# Patient Record
Sex: Female | Born: 1970 | Race: Black or African American | Hispanic: No | Marital: Single | Smoking: Current every day smoker
Health system: Southern US, Community
[De-identification: ages and names within clinical notes are randomized; demographics above are authoritative.]

## PROBLEM LIST (undated history)

## (undated) DIAGNOSIS — I10 Essential (primary) hypertension: Secondary | ICD-10-CM

## (undated) HISTORY — PX: ANKLE SURGERY: SHX546

---

## 2011-06-19 ENCOUNTER — Emergency Department (HOSPITAL_COMMUNITY)
Admission: EM | Admit: 2011-06-19 | Discharge: 2011-06-19 | Disposition: A | Discharge status: Home / Self Care | Payer: Self-pay | Attending: Emergency Medicine | Admitting: Emergency Medicine

## 2011-06-19 DIAGNOSIS — B349 Viral infection, unspecified: Secondary | ICD-10-CM

## 2011-06-19 DIAGNOSIS — R6883 Chills (without fever): Secondary | ICD-10-CM | POA: Insufficient documentation

## 2011-06-19 DIAGNOSIS — B9789 Other viral agents as the cause of diseases classified elsewhere: Secondary | ICD-10-CM | POA: Insufficient documentation

## 2011-06-19 DIAGNOSIS — R0602 Shortness of breath: Secondary | ICD-10-CM | POA: Insufficient documentation

## 2011-06-19 DIAGNOSIS — R109 Unspecified abdominal pain: Secondary | ICD-10-CM | POA: Insufficient documentation

## 2011-06-19 DIAGNOSIS — R112 Nausea with vomiting, unspecified: Secondary | ICD-10-CM | POA: Insufficient documentation

## 2011-06-19 DIAGNOSIS — R05 Cough: Secondary | ICD-10-CM | POA: Insufficient documentation

## 2011-06-19 DIAGNOSIS — R42 Dizziness and giddiness: Secondary | ICD-10-CM | POA: Insufficient documentation

## 2011-06-19 DIAGNOSIS — R61 Generalized hyperhidrosis: Secondary | ICD-10-CM | POA: Insufficient documentation

## 2011-06-19 DIAGNOSIS — R059 Cough, unspecified: Secondary | ICD-10-CM | POA: Insufficient documentation

## 2011-06-19 MED ORDER — DOXYCYCLINE HYCLATE 100 MG PO CAPS: 100.0000 mg | ORAL_CAPSULE | Freq: Two times a day (BID) | ORAL | Status: AC

## 2011-06-19 NOTE — ED Notes (Signed)
Patient presents with cough, chills, sweats, vomiting x 2 and body aches x 1 day.

## 2011-06-19 NOTE — ED Provider Notes (Cosign Needed)
History     CSN: [id number] Arrival date & time: 06/19/2011 10:21 AM   First MD Initiated Contact with Patient 06/19/11 1102      Chief Complaint  Patient presents with  . Cough  . Chills    (Consider location/radiation/quality/duration/timing/severity/associated sxs/prior treatment) HPI  Patient relates yesterday she started feeling lightheaded, having chills, sweats, diffuse abdominal tenderness with nausea and posttussive vomiting, she denies diarrhea, sore throat. States she is coughing and sometimes has blood streaked brown sputum. She has a history of bronchitis with wheezing in the past however she denies wheezing with this episode. She states sometimes she feels short of breath.  History reviewed. No pertinent past medical history.  Bronchitis with bronchospasm  Past Surgical History  Procedure Date  . Ankle surgery     History reviewed. No pertinent family history.  History  Substance Use Topics  . Smoking status: Current Everyday Smoker  . Smokeless tobacco: Not on file  . Alcohol Use: Yes   patient works at [employer] History    Grav Para Term Preterm Abortions TAB SAB Ect Mult Living                  Review of Systems  All other systems reviewed and are negative.    Allergies  Review of patient's allergies indicates no known allergies.  Home Medications   Current Outpatient Rx  Name Route Sig Dispense Refill  . IBUPROFEN 200 MG PO TABS Oral Take 400 mg by mouth every 6 (six) hours as needed. For pain     . PSEUDOEPH-DOXYLAMINE-DM-APAP 60-7.12-06-998 MG/30ML PO LIQD Oral Take 30 mLs by mouth at bedtime as needed. For cold     . DOXYCYCLINE HYCLATE 100 MG PO CAPS Oral Take 1 capsule (100 mg total) by mouth 2 (two) times daily. 20 capsule 0    BP 143/83  Pulse 95  Temp(Src) 99.6 F (37.6 C) (Oral)  Resp 18  SpO2 94%  LMP 05/23/2011 Vital signs show low-grade fever otherwise normal  Physical Exam  Nursing note  reviewed. Constitutional: She is oriented to person, place, and time. She appears well-developed and well-nourished.  Non-toxic appearance. She does not appear ill. No distress.  HENT:  Head: Normocephalic and atraumatic.  Right Ear: External ear normal.  Left Ear: External ear normal.  Nose: Nose normal. No mucosal edema or rhinorrhea.  Mouth/Throat: Oropharynx is clear and moist and mucous membranes are normal. No dental abscesses or uvula swelling.  Eyes: Conjunctivae and EOM are normal. Pupils are equal, round, and reactive to light.  Neck: Normal range of motion and full passive range of motion without pain. Neck supple.  Cardiovascular: Normal rate, regular rhythm and normal heart sounds.  Exam reveals no gallop and no friction rub.   No murmur heard. Pulmonary/Chest: Effort normal and breath sounds normal. No respiratory distress. She has no wheezes. She has no rhonchi. She has no rales. She exhibits no tenderness and no crepitus.        Patient noted to have cough at times.  Abdominal: Soft. Normal appearance and bowel sounds are normal. She exhibits no distension. There is no tenderness. There is no rebound and no guarding.  Musculoskeletal: Normal range of motion. She exhibits no edema and no tenderness.       Moves all extremities well.   Neurological: She is alert and oriented to person, place, and time. She has normal strength. No cranial nerve deficit.  Skin: Skin is warm, dry and intact.  No rash noted. No erythema. No pallor.  Psychiatric: She has a normal mood and affect. Her speech is normal and behavior is normal. Her mood appears not anxious.    ED Course  Procedures (including critical care time)    Diagnoses that have been ruled out:  Diagnoses that are still under consideration:  Final diagnoses:  Viral illness   New Prescriptions   DOXYCYCLINE (VIBRAMYCIN) 100 MG CAPSULE    Take 1 capsule (100 mg total) by mouth 2 (two) times daily.    Plan discharge  Devoria Albe, MD, FACEP   MDM          Ward Givens, MD 06/19/11 [phone]

## 2012-12-14 ENCOUNTER — Emergency Department (HOSPITAL_COMMUNITY): Payer: Self-pay

## 2012-12-14 ENCOUNTER — Encounter (HOSPITAL_COMMUNITY): Payer: Self-pay | Admitting: Emergency Medicine

## 2012-12-14 ENCOUNTER — Emergency Department (HOSPITAL_COMMUNITY)
Admission: EM | Admit: 2012-12-14 | Discharge: 2012-12-14 | Disposition: A | Discharge status: Home / Self Care | Payer: Self-pay | Attending: Emergency Medicine | Admitting: Emergency Medicine

## 2012-12-14 DIAGNOSIS — R42 Dizziness and giddiness: Secondary | ICD-10-CM | POA: Insufficient documentation

## 2012-12-14 DIAGNOSIS — R209 Unspecified disturbances of skin sensation: Secondary | ICD-10-CM | POA: Insufficient documentation

## 2012-12-14 DIAGNOSIS — R55 Syncope and collapse: Secondary | ICD-10-CM

## 2012-12-14 DIAGNOSIS — I10 Essential (primary) hypertension: Secondary | ICD-10-CM

## 2012-12-14 DIAGNOSIS — R200 Anesthesia of skin: Secondary | ICD-10-CM

## 2012-12-14 DIAGNOSIS — F17200 Nicotine dependence, unspecified, uncomplicated: Secondary | ICD-10-CM | POA: Insufficient documentation

## 2012-12-14 LAB — BASIC METABOLIC PANEL
BUN: 16 mg/dL (ref 6–23)
CO2: 25 mEq/L (ref 19–32)
Calcium: 9.2 mg/dL (ref 8.4–10.5)
Chloride: 101 mEq/L (ref 96–112)
Creatinine, Ser: 0.91 mg/dL (ref 0.50–1.10)
GFR calc Af Amer: >90 mL/min (ref >90)
GFR calc non Af Amer: 78 mL/min — ABNORMAL LOW (ref >90)
Glucose, Bld: 85 mg/dL (ref 70–99)
Potassium: 3.9 mEq/L (ref 3.5–5.1)
Sodium: 136 mEq/L (ref 135–145)

## 2012-12-14 LAB — URINALYSIS, ROUTINE W REFLEX MICROSCOPIC
Bilirubin Urine: NEGATIVE
Glucose, UA: 100 mg/dL — AB
Hgb urine dipstick: NEGATIVE
Ketones, ur: NEGATIVE mg/dL
Leukocytes, UA: NEGATIVE
Nitrite: NEGATIVE
Protein, ur: NEGATIVE mg/dL
Specific Gravity, Urine: 1.012 (ref 1.005–1.030)
Urobilinogen, UA: 0.2 mg/dL (ref 0.0–1.0)
pH: 7 (ref 5.0–8.0)

## 2012-12-14 LAB — CBC WITH DIFFERENTIAL/PLATELET
Basophils Absolute: 0 10*3/uL (ref 0.0–0.1)
Basophils Relative: 1 % (ref 0–1)
Eosinophils Absolute: 0.1 10*3/uL (ref 0.0–0.7)
Eosinophils Relative: 2 % (ref 0–5)
HCT: 35.7 % — ABNORMAL LOW (ref 36.0–46.0)
Hemoglobin: 12.4 g/dL (ref 12.0–15.0)
Lymphocytes Relative: 27 % (ref 12–46)
Lymphs Abs: 1.7 10*3/uL (ref 0.7–4.0)
MCH: 30.6 pg (ref 26.0–34.0)
MCHC: 34.7 g/dL (ref 30.0–36.0)
MCV: 88.1 fL (ref 78.0–100.0)
Monocytes Absolute: 0.4 10*3/uL (ref 0.1–1.0)
Monocytes Relative: 6 % (ref 3–12)
Neutro Abs: 4.1 10*3/uL (ref 1.7–7.7)
Neutrophils Relative %: 65 % (ref 43–77)
Platelets: 326 10*3/uL (ref 150–400)
RBC: 4.05 MIL/uL (ref 3.87–5.11)
RDW: 14.6 % (ref 11.5–15.5)
WBC: 6.4 10*3/uL (ref 4.0–10.5)

## 2012-12-14 MED ORDER — SODIUM CHLORIDE 0.9 % IV BOLUS (SEPSIS)
1000.0000 mL | Freq: Once | INTRAVENOUS | Status: AC
  Administered 2012-12-14: 1000 mL via INTRAVENOUS

## 2012-12-14 MED ORDER — ASPIRIN 81 MG PO CHEW: 81.0000 mg | CHEWABLE_TABLET | Freq: Every day | ORAL | Status: DC

## 2012-12-14 NOTE — Consult Note (Signed)
Reason for Consult: Dizziness associated with left-sided numbness Referring Physician: Dr Ranae Palms  CC: Dizziness, weakness, and left-sided numbness x 7 hours  HPI: Jasmine Mcguire is a 42 y.o. female who currently works 2 jobs. 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 The patient was at work this morning at the [employer] when just after 7 AM she suddenly felt dizzy as if she were about to pass out. This was associated with left-sided numbness and generalized weakness. A short while later her left lower extremity felt weak ; although, she was able to stand. She tried to continue on with her work but her symptoms did not improve. She sat down and rested but they're was no change in her symptoms with sitting. The patient reported this to her boss who suggested that she seek medical attention. The patient presented to the [facility] emergency department and a CT scan was performed which was negative. Currently at 2:30 PM the patient reports some mild improvement in her symptoms although they have not resolved. Her blood pressure has been elevated during this time and she has a mildly fast heart rate in the range of 80-100 beats per minute. Oxygen saturation is 100% on room air. The patient has no primary care physician and no regular medical followup. She does not know when she last had her blood pressure checked. She admits to feeling tired but denies anxiety, depression,  She has never had similar symptoms.  History reviewed. No pertinent past medical history. The patient believes she is healthy but admits to having no regular medical checkups. She does not think that she has hypertension, hyperlipidemia or diabetes  Past Surgical History  Procedure Laterality Date  . Ankle surgery      Family history: The patient's mother died in her 33s from a  brain tumor. Her father died in his 66s. She is not certain of the cause although she knows that he had diabetes and hypertension.  Social History:  The patient currently smokes 1 pack cigarettes per day.  She has been smoking for approximately 2 years. She does not have any smokeless tobacco history on file. She drinks alcohol occasionally. She reports that she does not use illicit drugs. She lives in [place] with her fianc. She has 4 children. As noted she works 2 jobs.  No Known Allergies  Medications: No current facility-administered medications for this encounter. Current outpatient prescriptions:ASPIRIN EC PO, Take 1 tablet by mouth once. Received medication from a coworker this morning (12/14/12), Disp: , Rfl:  The patient does not take any medications on a regular basis. She did have one aspirin this morning given to her by a coworker.  ROS: Review of Systems  Constitutional: Positive for malaise/fatigue. Negative for fever, chills, weight loss and diaphoresis.  HENT: Negative for hearing loss, ear pain, nosebleeds, congestion, sore throat, neck pain, tinnitus and ear discharge.   Eyes: Negative for blurred vision, double vision, photophobia, pain, discharge and redness.  Respiratory: Negative for cough, hemoptysis, sputum production, shortness of breath, wheezing and stridor.   Cardiovascular: Negative for chest pain, palpitations, orthopnea, claudication, leg swelling and PND.  Gastrointestinal: Positive for heartburn and vomiting. Negative for nausea, abdominal pain, diarrhea, constipation, blood in stool and melena.  Genitourinary: Negative for dysuria, urgency, frequency, hematuria and flank pain.  Musculoskeletal: Negative for myalgias, back pain, joint  pain and falls.  Skin: Negative for itching and rash.  Neurological: Positive for dizziness, sensory change, focal weakness, weakness and headaches.  Psychiatric/Behavioral: Negative for depression, suicidal ideas and substance  abuse. The patient is not nervous/anxious.     Physical Examination: Blood pressure 180/106, pulse 85, temperature 98.8 F (37.1 C), temperature source Oral, resp. rate 17, height 5\' 11"  (1.803 m), weight 106.142 kg (234 lb), last menstrual period 12/12/2012, SpO2 99.00%.  General - Pleasant soft-spoken 42 year old female in no acute distress Heart - Regular rate and rhythm - no murmer Lungs - Clear to auscultation Abdomen - Soft - non tender Extremities - Distal pulses intact - trace edema bilaterally lower extremities Skin - Warm and dry   Neurologic Examination Mental Status: Alert, oriented, thought content appropriate.  Speech fluent without evidence of aphasia.  Able to follow 3 step commands without difficulty. Cranial Nerves: II: Discs not visualized; Visual fields grossly normal, pupils equal, round, reactive to light and accommodation III,IV, VI: ptosis not present, extra-ocular motions intact bilaterally V,VII: smile symmetric, facial light touch sensation normal bilaterally VIII: hearing normal bilaterally IX,X: gag reflex present XI: bilateral shoulder shrug XII: midline tongue extension Motor: Right : Upper extremity   5/5    Left:     Upper extremity   5/5  Lower extremity   5/5     Lower extremity   5/5 Tone and bulk:normal tone throughout; no atrophy noted Sensory: Light touch intact throughout except for mildly decreased sensation of the left upper extremity. Deep Tendon Reflexes: 2+ and symmetric throughout Plantars: Right: downgoing   Left: downgoing Cerebellar: normal finger-to-nose, normal rapid alternating movements and normal heel-to-shin test Gait: Deferred CV: pulses palpable throughout   Laboratory Studies:   Basic Metabolic Panel:  Recent Labs Lab 12/14/12 1040  NA 136  K 3.9  CL 101  CO2 25  GLUCOSE 85  BUN 16  CREATININE 0.91  CALCIUM 9.2    Liver Function Tests: No results found for this basename: AST, ALT, ALKPHOS, BILITOT, PROT,  ALBUMIN,  in the last 168 hours No results found for this basename: LIPASE, AMYLASE,  in the last 168 hours No results found for this basename: AMMONIA,  in the last 168 hours  CBC:  Recent Labs Lab 12/14/12 1040  WBC 6.4  NEUTROABS 4.1  HGB 12.4  HCT 35.7*  MCV 88.1  PLT 326    Cardiac Enzymes: No results found for this basename: CKTOTAL, CKMB, CKMBINDEX, TROPONINI,  in the last 168 hours  BNP: No components found with this basename: POCBNP,   CBG: No results found for this basename: GLUCAP,  in the last 168 hours  Microbiology: No results found for this or any previous visit.  Coagulation Studies: No results found for this basename: LABPROT, INR,  in the last 72 hours  Urinalysis:  Recent Labs Lab 12/14/12 1103  COLORURINE YELLOW  LABSPEC 1.012  PHURINE 7.0  GLUCOSEU 100*  HGBUR NEGATIVE  BILIRUBINUR NEGATIVE  KETONESUR NEGATIVE  PROTEINUR NEGATIVE  UROBILINOGEN 0.2  NITRITE NEGATIVE  LEUKOCYTESUR NEGATIVE    Lipid Panel:  No results found for this basename: chol, trig, hdl, cholhdl, vldl, ldlcalc    HgbA1C:  No results found for this basename: HGBA1C    Urine Drug Screen:   No results found for this basename: labopia, cocainscrnur, labbenz, amphetmu, thcu, labbarb    Alcohol Level: No results found for this basename: ETH,  in the last 168 hours  Other results: EKG: - Sinus rhythm rate  86 beats per minute  Imaging:  Ct Head Wo Contrast  12/14/2012  No evidence of acute intracranial abnormality.       Assessment/Plan:   42 year old female who presented to the emergency department this morning for evaluation of dizziness, left-sided numbness, presyncope, mild left lower extremity weakness and generalized weakness. Her symptoms have improved but have not resolved. A CT scan of the head was negative. Her exam is essentially normal other than slightly decreased sensation to light touch in the left upper extremity. She is hypertensive by all blood  pressures taken in the emergency department. She has no regular physician or medical followup. She is not on any medications. She is a one pack per day smoker.  At this point would recommend starting a low dose antihypertensive medication and encouraging the patient to become established with a primary care physician as soon as possible. Low-dose aspirin could be considered once the patient's blood pressure is controlled. His smoking cessation should be strongly encouraged.  Final assessment and plan to be determined by Dr. Cyril Mourning. Discussed with Dr. Cyril Mourning. Will order an MRI for further evaluation.She can be discharge home on low dose aspirin if the MRI is normal.  Hassel Neth [facility] Pager [phone] 12/14/2012, 3:03 PM  Patient seen and examine  separately and I gree with above assessment and plan.  Wyatt Portela, MD [facility].

## 2012-12-14 NOTE — ED Notes (Signed)
IV start attempted by 2 personnel.  IV access not able to be obtained.

## 2012-12-14 NOTE — ED Notes (Signed)
Lunch tray ordered for pt from nutritional services.

## 2012-12-14 NOTE — ED Provider Notes (Signed)
History     CSN: [id number]  Arrival date & time 12/14/12  0906   First MD Initiated Contact with Patient 12/14/12 1003      Chief Complaint  Patient presents with  . Numbness    (Consider location/radiation/quality/duration/timing/severity/associated sxs/prior treatment) HPI Patient presents to the emergency department with lightheadedness, and tingling in her left extremities.  Patient, states it began just before 7 AM.  Patient, states she was at work when the symptoms started.  Patient, states she felt overheated and hot at the time.  Patient, states, that her lightheadedness, that like she was going to pass out.  Patient denies chest pain, shortness of breath, vomiting, nausea, abdominal pain, back pain, blurred vision, headache, fever, syncope, or dysuria.  Patient, states she did not take any medications prior to arrival.  Patient, states, that the symptoms were fairly constant since the onset. History reviewed. No pertinent past medical history.  Past Surgical History  Procedure Laterality Date  . Ankle surgery      History reviewed. No pertinent family history.  History  Substance Use Topics  . Smoking status: Current Every Day Smoker  . Smokeless tobacco: Not on file  . Alcohol Use: Yes    OB History   Grav Para Term Preterm Abortions TAB SAB Ect Mult Living                  Review of Systems All other systems negative except as documented in the HPI. All pertinent positives and negatives as reviewed in the HPI. Allergies  Review of patient's allergies indicates no known allergies.  Home Medications   Current Outpatient Rx  Name  Route  Sig  Dispense  Refill  . ASPIRIN EC PO   Oral   Take 1 tablet by mouth once. Received medication from a coworker this morning (12/14/12)           BP 180/106  Pulse 85  Temp(Src) 98.8 F (37.1 C) (Oral)  Resp 17  Ht 5\' 11"  (1.803 m)  Wt 234 lb (106.142 kg)  BMI 32.65 kg/m2  SpO2 99%  LMP 12/12/2012  Physical  Exam  Nursing note and vitals reviewed. Constitutional: She is oriented to person, place, and time. She appears well-developed and well-nourished. No distress.  HENT:  Head: Normocephalic and atraumatic.  Mouth/Throat: Oropharynx is clear and moist.  Eyes: EOM are normal. Pupils are equal, round, and reactive to light.  Neck: Normal range of motion. Neck supple. No tracheal deviation present.  Cardiovascular: Normal rate, regular rhythm, normal heart sounds and intact distal pulses.  Exam reveals no gallop and no friction rub.   No murmur heard. Pulmonary/Chest: Effort normal and breath sounds normal. No respiratory distress.  Abdominal: Soft. Bowel sounds are normal. She exhibits no distension.  Lymphadenopathy:    She has no cervical adenopathy.  Neurological: She is alert and oriented to person, place, and time. She has normal strength. No sensory deficit. She exhibits normal muscle tone. Coordination and gait normal. GCS eye subscore is 4. GCS verbal subscore is 5. GCS motor subscore is 6.  Patient is 5 out 5 strength in all 4 extremities.  Patient does not have any neurological deficits.  She does have sharp and dull sensation intact.  Skin: Skin is warm and dry. No rash noted.    ED Course  Procedures (including critical care time)  Labs Reviewed  CBC WITH DIFFERENTIAL - Abnormal; Notable for the following:    HCT 35.7 (*)  All other components within normal limits  BASIC METABOLIC PANEL - Abnormal; Notable for the following:    GFR calc non Af Amer 78 (*)    All other components within normal limits  URINALYSIS, ROUTINE W REFLEX MICROSCOPIC - Abnormal; Notable for the following:    Glucose, UA 100 (*)    All other components within normal limits   Ct Head Wo Contrast  12/14/2012   *RADIOLOGY REPORT*  Clinical Data: Dizziness, left-sided numbness  CT HEAD WITHOUT CONTRAST  Technique:  Contiguous axial images were obtained from the base of the skull through the vertex without  contrast.  Comparison: None.  Findings: No evidence of parenchymal hemorrhage or extra-axial fluid collection. No mass lesion, mass effect, or midline shift.  No CT evidence of acute infarction.  Cerebral volume is age appropriate.  No ventriculomegaly.  The visualized paranasal sinuses are essentially clear. The mastoid air cells are unopacified.  No evidence of calvarial fracture.  IMPRESSION: No evidence of acute intracranial abnormality.   Original Report Authenticated By: Charline Bills, M.D.    Spoke with neurology about the patient, who will come and evaluate the patient.  This most likely is related to possibly her blood pressure.  Her symptoms of tingling have improved.  The patient, states, that she still feels generalized weakness.  She does not have any focal neurological deficits noted on exam.  Patient has been ambulating to the bathroom without difficulty.  Neurology has come to evaluate the patient feels that these are atypical symptoms, but we'll get an MRI.  Patient continues to ambulate without difficulty and does not have any focal deficits. MDM  MDM Reviewed: nursing note, vitals and previous chart Interpretation: labs, ECG and CT scan Consults: neurology            Carlyle Dolly, PA-C 12/14/12 1626

## 2012-12-14 NOTE — ED Notes (Signed)
Pt presents to ED with complaints of dizziness and numbness in left arm that started at 7 am this morning while she was at work.

## 2012-12-14 NOTE — ED Notes (Signed)
Pt ambulated to restroom prior to going to room without difficulty.

## 2012-12-14 NOTE — ED Provider Notes (Signed)
Patient signed out to me by Ebbie Ridge, PA-C.  Patient presents to the ED with numbness.  She has had extensive workup today, including MRI.  Patient has been seen by Neurology, and they recommend discharge home with low-dose aspirin.  Patient has been hypertensive, but she has no history, and has not been recorded before, therefore will not start antihypertensive treatment at this time.  Will refer to the adult care clinic.  Follow-up in 2 days.  Return for worsening symptoms.  Patient discussed with Neuro PA, whose note has been reviewed.  Discussed with Dr. Hyacinth Meeker, who agrees with the plan as laid out by Neurology.  Patient is stable and ready for discharge.  She understands and agrees with the plan.  VSS.  Patient is neurovascularly intact.  Roxy Horseman, PA-C 12/14/12 [phone]

## 2012-12-14 NOTE — ED Notes (Signed)
Pt states she became dizzy, shaky, and had numbness to L side at work this morning.  Pt states she drove herself to ED.  Pt ambulatory in ED and ambulated to restroom after being triaged, but before presenting in pt's room.  Pt ambulated independently and without assistance.  Pt alert and oriented and in NAD.

## 2012-12-14 NOTE — ED Notes (Signed)
Discussed lack of IV access with [place], [place].  PA would like IV team to attempt access.  IV team paged and will be here shortly.

## 2012-12-15 NOTE — ED Provider Notes (Signed)
  Medical screening examination/treatment/procedure(s) were performed by non-physician practitioner and as supervising physician I was immediately available for consultation/collaboration.    Abdelrahman Nair D Delynda Sepulveda, MD 12/15/12 2115 

## 2012-12-15 NOTE — ED Provider Notes (Signed)
Medical screening examination/treatment/procedure(s) were performed by non-physician practitioner and as supervising physician I was immediately available for consultation/collaboration.   Loren Racer, MD 12/15/12 1500

## 2012-12-29 ENCOUNTER — Emergency Department (HOSPITAL_COMMUNITY)
Admission: EM | Admit: 2012-12-29 | Discharge: 2012-12-29 | Disposition: A | Discharge status: Home / Self Care | Payer: No Typology Code available for payment source | Attending: Emergency Medicine | Admitting: Emergency Medicine

## 2012-12-29 ENCOUNTER — Encounter (HOSPITAL_COMMUNITY): Payer: Self-pay | Admitting: Nurse Practitioner

## 2012-12-29 ENCOUNTER — Emergency Department (HOSPITAL_COMMUNITY): Payer: No Typology Code available for payment source

## 2012-12-29 DIAGNOSIS — Z7982 Long term (current) use of aspirin: Secondary | ICD-10-CM | POA: Insufficient documentation

## 2012-12-29 DIAGNOSIS — S161XXA Strain of muscle, fascia and tendon at neck level, initial encounter: Secondary | ICD-10-CM

## 2012-12-29 DIAGNOSIS — F17200 Nicotine dependence, unspecified, uncomplicated: Secondary | ICD-10-CM | POA: Insufficient documentation

## 2012-12-29 DIAGNOSIS — S139XXA Sprain of joints and ligaments of unspecified parts of neck, initial encounter: Secondary | ICD-10-CM | POA: Insufficient documentation

## 2012-12-29 DIAGNOSIS — Y92410 Unspecified street and highway as the place of occurrence of the external cause: Secondary | ICD-10-CM | POA: Insufficient documentation

## 2012-12-29 DIAGNOSIS — IMO0002 Reserved for concepts with insufficient information to code with codable children: Secondary | ICD-10-CM | POA: Insufficient documentation

## 2012-12-29 DIAGNOSIS — Z79899 Other long term (current) drug therapy: Secondary | ICD-10-CM | POA: Insufficient documentation

## 2012-12-29 DIAGNOSIS — Y939 Activity, unspecified: Secondary | ICD-10-CM | POA: Insufficient documentation

## 2012-12-29 MED ORDER — OXYCODONE-ACETAMINOPHEN 5-325 MG PO TABS
2.0000 | ORAL_TABLET | Freq: Once | ORAL | Status: AC
  Administered 2012-12-29: 2 via ORAL
  Filled 2012-12-29: qty 2

## 2012-12-29 MED ORDER — HYDROCODONE-ACETAMINOPHEN 5-325 MG PO TABS: 2.0000 | ORAL_TABLET | Freq: Four times a day (QID) | ORAL | Status: DC | PRN

## 2012-12-29 MED ORDER — IBUPROFEN 600 MG PO TABS: 600.0000 mg | ORAL_TABLET | Freq: Four times a day (QID) | ORAL | Status: DC | PRN

## 2012-12-29 MED ORDER — METHOCARBAMOL 500 MG PO TABS: 500.0000 mg | ORAL_TABLET | Freq: Two times a day (BID) | ORAL | Status: DC
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
## 2012-12-29 NOTE — Discharge Instructions (Signed)

## 2012-12-31 NOTE — ED Provider Notes (Signed)
Medical screening examination/treatment/procedure(s) were performed by non-physician practitioner and as supervising physician I was immediately available for consultation/collaboration.  Tannie Koskela, MD 12/31/12 0106 

## 2014-09-25 ENCOUNTER — Encounter (HOSPITAL_BASED_OUTPATIENT_CLINIC_OR_DEPARTMENT_OTHER): Payer: Self-pay

## 2014-09-25 ENCOUNTER — Emergency Department (HOSPITAL_BASED_OUTPATIENT_CLINIC_OR_DEPARTMENT_OTHER)
Admission: EM | Admit: 2014-09-25 | Discharge: 2014-09-25 | Disposition: A | Discharge status: Home / Self Care | Payer: Self-pay | Attending: Emergency Medicine | Admitting: Emergency Medicine

## 2014-09-25 DIAGNOSIS — I10 Essential (primary) hypertension: Secondary | ICD-10-CM | POA: Insufficient documentation

## 2014-09-25 DIAGNOSIS — H538 Other visual disturbances: Secondary | ICD-10-CM | POA: Insufficient documentation

## 2014-09-25 DIAGNOSIS — R112 Nausea with vomiting, unspecified: Secondary | ICD-10-CM | POA: Insufficient documentation

## 2014-09-25 DIAGNOSIS — Z720 Tobacco use: Secondary | ICD-10-CM | POA: Insufficient documentation

## 2014-09-25 LAB — CBC WITH DIFFERENTIAL/PLATELET
BASOS ABS: 0 10*3/uL (ref 0.0–0.1)
BASOS PCT: 1 % (ref 0–1)
Eosinophils Absolute: 0.2 10*3/uL (ref 0.0–0.7)
Eosinophils Relative: 4 % (ref 0–5)
HCT: 31.8 % — ABNORMAL LOW (ref 36.0–46.0)
Hemoglobin: 10.4 g/dL — ABNORMAL LOW (ref 12.0–15.0)
Lymphocytes Relative: 30 % (ref 12–46)
Lymphs Abs: 1.7 10*3/uL (ref 0.7–4.0)
MCH: 27.5 pg (ref 26.0–34.0)
MCHC: 32.7 g/dL (ref 30.0–36.0)
MCV: 84.1 fL (ref 78.0–100.0)
MONO ABS: 0.5 10*3/uL (ref 0.1–1.0)
Monocytes Relative: 9 % (ref 3–12)
NEUTROS ABS: 3.1 10*3/uL (ref 1.7–7.7)
NEUTROS PCT: 56 % (ref 43–77)
PLATELETS: 394 10*3/uL (ref 150–400)
RBC: 3.78 MIL/uL — AB (ref 3.87–5.11)
RDW: 14.5 % (ref 11.5–15.5)
WBC: 5.5 10*3/uL (ref 4.0–10.5)

## 2014-09-25 LAB — COMPREHENSIVE METABOLIC PANEL
ALK PHOS: 51 U/L (ref 39–117)
ALT: 15 U/L (ref 0–35)
AST: 17 U/L (ref 0–37)
Albumin: 3.5 g/dL (ref 3.5–5.2)
Anion gap: 9 (ref 5–15)
BUN: 12 mg/dL (ref 6–23)
CALCIUM: 8.8 mg/dL (ref 8.4–10.5)
CO2: 28 mmol/L (ref 19–32)
Chloride: 99 mmol/L (ref 96–112)
Creatinine, Ser: 1.07 mg/dL (ref 0.50–1.10)
GFR calc Af Amer: 73 mL/min — ABNORMAL LOW (ref >90)
GFR calc non Af Amer: 63 mL/min — ABNORMAL LOW (ref >90)
Glucose, Bld: 95 mg/dL (ref 70–99)
POTASSIUM: 3 mmol/L — AB (ref 3.5–5.1)
SODIUM: 136 mmol/L (ref 135–145)
TOTAL PROTEIN: 7 g/dL (ref 6.0–8.3)
Total Bilirubin: 0.5 mg/dL (ref 0.3–1.2)

## 2014-09-25 LAB — LIPASE, BLOOD: LIPASE: 23 U/L (ref 11–59)

## 2014-09-25 MED ORDER — PROMETHAZINE HCL 25 MG PO TABS: 25.0000 mg | ORAL_TABLET | Freq: Four times a day (QID) | ORAL | Status: AC | PRN

## 2014-09-25 MED ORDER — AMLODIPINE BESYLATE 5 MG PO TABS: 5.0000 mg | ORAL_TABLET | Freq: Every day | ORAL | Status: DC

## 2014-09-25 MED ORDER — SODIUM CHLORIDE 0.9 % IV BOLUS (SEPSIS)
500.0000 mL | Freq: Once | INTRAVENOUS | Status: AC
  Administered 2014-09-25: 500 mL via INTRAVENOUS

## 2014-09-25 MED ORDER — ONDANSETRON HCL 4 MG/2ML IJ SOLN
4.0000 mg | Freq: Once | INTRAMUSCULAR | Status: AC
Start: 2014-09-25 — End: 2014-09-25
  Administered 2014-09-25: 4 mg via INTRAVENOUS
  Filled 2014-09-25: qty 2

## 2014-09-25 NOTE — ED Provider Notes (Signed)
CSN: [id number]     Arrival date & time 09/25/14  [id number] History   First MD Initiated Contact with Patient 09/25/14 1003     Chief Complaint  Patient presents with  . Nausea     (Consider location/radiation/quality/duration/timing/severity/associated sxs/prior Treatment) The history is provided by the patient.   patient has a few day history of nausea and vomiting. Mild upper abdominal pain. No sick contacts. States she has had a decent appetite but every time she eats comes back up. She states she's had some chills. She states that her vision gets little blurry at times when she stands up. States she feels as if she will pass out. She denies possibility of pregnancy. She smokes a pack a day. No chest pain. Her blood pressure is elevated here. States she's been high in the past but never been on medications. No confusion. No change in her urination.  History reviewed. No pertinent past medical history. Past Surgical History  Procedure Laterality Date  . Ankle surgery     No family history on file. History  Substance Use Topics  . Smoking status: Current Every Day Smoker -- 1.00 packs/day for 7 years    Types: Cigarettes  . Smokeless tobacco: Not on file  . Alcohol Use: Yes     Comment: occasional   OB History    No data available     Review of Systems  Constitutional: Positive for chills and fatigue. Negative for activity change and appetite change.  Eyes: Positive for visual disturbance. Negative for pain.  Respiratory: Negative for chest tightness and shortness of breath.   Cardiovascular: Negative for chest pain and leg swelling.  Gastrointestinal: Positive for nausea, vomiting and abdominal pain. Negative for diarrhea.  Genitourinary: Negative for flank pain.  Musculoskeletal: Negative for back pain and neck stiffness.  Skin: Negative for rash.  Neurological: Positive for light-headedness. Negative for weakness, numbness and headaches.  Psychiatric/Behavioral: Negative for  behavioral problems.      Allergies  Review of patient's allergies indicates no known allergies.  Home Medications   Prior to Admission medications   Medication Sig Start Date End Date Taking? Authorizing Provider  amLODipine (NORVASC) 5 MG tablet Take 1 tablet (5 mg total) by mouth daily. 09/25/14   Benjiman CoreNathan Ryka Beighley, MD  promethazine (PHENERGAN) 25 MG tablet Take 1 tablet (25 mg total) by mouth every 6 (six) hours as needed for nausea. 09/25/14   Benjiman CoreNathan Kiya Eno, MD   BP 181/101 mmHg  Pulse 76  Temp(Src) 98.3 F (36.8 C) (Oral)  Resp 16  Ht 5\' 11"  (1.803 m)  Wt 245 lb (111.131 kg)  BMI 34.19 kg/m2  SpO2 100% Physical Exam  Constitutional: She appears well-developed.  HENT:  Head: Normocephalic and atraumatic.  Eyes: Pupils are equal, round, and reactive to light.  Cardiovascular: Normal rate and regular rhythm.   Pulmonary/Chest: Effort normal.  Abdominal: Soft. There is no tenderness.  Musculoskeletal: Normal range of motion.  Neurological: She is alert.  Skin: Skin is warm.    ED Course  Procedures (including critical care time) Labs Review Labs Reviewed  CBC WITH DIFFERENTIAL/PLATELET - Abnormal; Notable for the following:    RBC 3.78 (*)    Hemoglobin 10.4 (*)    HCT 31.8 (*)    All other components within normal limits  COMPREHENSIVE METABOLIC PANEL - Abnormal; Notable for the following:    Potassium 3.0 (*)    GFR calc non Af Amer 63 (*)    GFR calc Af Amer 73 (*)  All other components within normal limits  LIPASE, BLOOD    Imaging Review No results found.   EKG Interpretation   Date/Time:  Friday September 25 2014 10:37:48 EDT Ventricular Rate:  81 PR Interval:  154 QRS Duration: 98 QT Interval:  398 QTC Calculation: 462 R Axis:     Text Interpretation:  Normal sinus rhythm Normal ECG ED PHYSICIAN  INTERPRETATION AVAILABLE IN [facility] Confirmed by TEST, Record  ([id number]) on 09/27/2014 8:52:33 AM      MDM   Final diagnoses:   Non-intractable vomiting with nausea, vomiting of unspecified type  Essential hypertension    Patient with nausea and vomting. HTN. Feels better after treatment. HTN will need chronic management. Will d/c    Benjiman Core, MD 09/28/14 [phone]

## 2014-09-25 NOTE — ED Notes (Signed)
Pt rorts a 1 week hx of nausea, vomiting, inability to keep anything down and blurred vision.  Also reports feeling light headed.  States she has had high blood pressure in the past but never medicated.

## 2014-09-25 NOTE — Discharge Instructions (Signed)
Hypertension °Hypertension, commonly called high blood pressure, is when the force of blood pumping through your arteries is too strong. Your arteries are the blood vessels that carry blood from your heart throughout your body. A blood pressure reading consists of a higher number over a lower number, such as 110/72. The higher number (systolic) is the pressure inside your arteries when your heart pumps. The lower number (diastolic) is the pressure inside your arteries when your heart relaxes. Ideally you want your blood pressure below 120/80. °Hypertension forces your heart to work harder to pump blood. Your arteries may become narrow or stiff. Having hypertension puts you at risk for heart disease, stroke, and other problems.  °RISK FACTORS °Some risk factors for high blood pressure are controllable. Others are not.  °Risk factors you cannot control include:  °· Race. You may be at higher risk if you are African American. °· Age. Risk increases with age. °· Gender. Men are at higher risk than women before age 45 years. After age 65, women are at higher risk than men. °Risk factors you can control include: °· Not getting enough exercise or physical activity. °· Being overweight. °· Getting too much fat, sugar, calories, or salt in your diet. °· Drinking too much alcohol. °SIGNS AND SYMPTOMS °Hypertension does not usually cause signs or symptoms. Extremely high blood pressure (hypertensive crisis) may cause headache, anxiety, shortness of breath, and nosebleed. °DIAGNOSIS  °To check if you have hypertension, your health care provider will measure your blood pressure while you are seated, with your arm held at the level of your heart. It should be measured at least twice using the same arm. Certain conditions can cause a difference in blood pressure between your right and left arms. A blood pressure reading that is higher than normal on one occasion does not mean that you need treatment. If one blood pressure reading  is high, ask your health care provider about having it checked again. °TREATMENT  °Treating high blood pressure includes making lifestyle changes and possibly taking medicine. Living a healthy lifestyle can help lower high blood pressure. You may need to change some of your habits. °Lifestyle changes may include: °· Following the DASH diet. This diet is high in fruits, vegetables, and whole grains. It is low in salt, red meat, and added sugars. °· Getting at least 2½ hours of brisk physical activity every week. °· Losing weight if necessary. °· Not smoking. °· Limiting alcoholic beverages. °· Learning ways to reduce stress. ° If lifestyle changes are not enough to get your blood pressure under control, your health care provider may prescribe medicine. You may need to take more than one. Work closely with your health care provider to understand the risks and benefits. °HOME CARE INSTRUCTIONS °· Have your blood pressure rechecked as directed by your health care provider.   °· Take medicines only as directed by your health care provider. Follow the directions carefully. Blood pressure medicines must be taken as prescribed. The medicine does not work as well when you skip doses. Skipping doses also puts you at risk for problems.   °· Do not smoke.   °· Monitor your blood pressure at home as directed by your health care provider.  °SEEK MEDICAL CARE IF:  °· You think you are having a reaction to medicines taken. °· You have recurrent headaches or feel dizzy. °· You have swelling in your ankles. °· You have trouble with your vision. °SEEK IMMEDIATE MEDICAL CARE IF: °· You develop a severe headache or confusion. °·   You have unusual weakness, numbness, or feel faint. °· You have severe chest or abdominal pain. °· You vomit repeatedly. °· You have trouble breathing. °MAKE SURE YOU:  °· Understand these instructions. °· Will watch your condition. °· Will get help right away if you are not doing well or get worse. °Document  Released: 06/26/2005 Document Revised: 11/10/2013 Document Reviewed: 04/18/2013 °Exit[employer]® Patient Information ©2015 [employer]. This information is not intended to replace advice given to you by your health care provider. Make sure you discuss any questions you have with your health care provider. ° °Nausea and Vomiting °Nausea is a sick feeling that often comes before throwing up (vomiting). Vomiting is a reflex where stomach contents come out of your mouth. Vomiting can cause severe loss of body fluids (dehydration). Children and elderly adults can become dehydrated quickly, especially if they also have diarrhea. Nausea and vomiting are symptoms of a condition or disease. It is important to find the cause of your symptoms. °CAUSES  °· Direct irritation of the stomach lining. This irritation can result from increased acid production (gastroesophageal reflux disease), infection, food poisoning, taking certain medicines (such as nonsteroidal anti-inflammatory drugs), alcohol use, or tobacco use. °· Signals from the brain. These signals could be caused by a headache, heat exposure, an inner ear disturbance, increased pressure in the brain from injury, infection, a tumor, or a concussion, pain, emotional stimulus, or metabolic problems. °· An obstruction in the gastrointestinal tract (bowel obstruction). °· Illnesses such as diabetes, hepatitis, gallbladder problems, appendicitis, kidney problems, cancer, sepsis, atypical symptoms of a heart attack, or eating disorders. °· Medical treatments such as chemotherapy and radiation. °· Receiving medicine that makes you sleep (general anesthetic) during surgery. °DIAGNOSIS °Your caregiver may ask for tests to be done if the problems do not improve after a few days. Tests may also be done if symptoms are severe or if the reason for the nausea and vomiting is not clear. Tests may include: °· Urine tests. °· Blood tests. °· Stool tests. °· Cultures (to look for evidence of  infection). °· X-rays or other imaging studies. °Test results can help your caregiver make decisions about treatment or the need for additional tests. °TREATMENT °You need to stay well hydrated. Drink frequently but in small amounts. You may wish to drink water, sports drinks, clear broth, or eat frozen ice pops or gelatin dessert to help stay hydrated. When you eat, eating slowly may help prevent nausea. There are also some antinausea medicines that may help prevent nausea. °HOME CARE INSTRUCTIONS  °· Take all medicine as directed by your caregiver. °· If you do not have an appetite, do not force yourself to eat. However, you must continue to drink fluids. °· If you have an appetite, eat a normal diet unless your caregiver tells you differently. °¨ Eat a variety of complex carbohydrates (rice, wheat, potatoes, bread), lean meats, yogurt, fruits, and vegetables. °¨ Avoid high-fat foods because they are more difficult to digest. °· Drink enough water and fluids to keep your urine clear or pale yellow. °· If you are dehydrated, ask your caregiver for specific rehydration instructions. Signs of dehydration may include: °¨ Severe thirst. °¨ Dry lips and mouth. °¨ Dizziness. °¨ Dark urine. °¨ Decreasing urine frequency and amount. °¨ Confusion. °¨ Rapid breathing or pulse. °SEEK IMMEDIATE MEDICAL CARE IF:  °· You have blood or brown flecks (like coffee grounds) in your vomit. °· You have black or bloody stools. °· You have a severe headache or stiff neck. °· You   are confused. °· You have severe abdominal pain. °· You have chest pain or trouble breathing. °· You do not urinate at least once every 8 hours. °· You develop cold or clammy skin. °· You continue to vomit for longer than 24 to 48 hours. °· You have a fever. °MAKE SURE YOU:  °· Understand these instructions. °· Will watch your condition. °· Will get help right away if you are not doing well or get worse. °Document Released: 06/26/2005 Document Revised: 09/18/2011  Document Reviewed: 11/23/2010 °ExitCare® Patient Information ©2015 [employer]. This information is not intended to replace advice given to you by your health care provider. Make sure you discuss any questions you have with your health care provider. ° °

## 2016-09-15 ENCOUNTER — Emergency Department (HOSPITAL_BASED_OUTPATIENT_CLINIC_OR_DEPARTMENT_OTHER)
Admission: EM | Admit: 2016-09-15 | Discharge: 2016-09-15 | Disposition: A | Discharge status: Home / Self Care | Payer: Self-pay | Attending: Emergency Medicine | Admitting: Emergency Medicine

## 2016-09-15 ENCOUNTER — Encounter (HOSPITAL_BASED_OUTPATIENT_CLINIC_OR_DEPARTMENT_OTHER): Payer: Self-pay | Admitting: *Deleted

## 2016-09-15 ENCOUNTER — Emergency Department (HOSPITAL_BASED_OUTPATIENT_CLINIC_OR_DEPARTMENT_OTHER): Payer: Self-pay

## 2016-09-15 DIAGNOSIS — I10 Essential (primary) hypertension: Secondary | ICD-10-CM | POA: Insufficient documentation

## 2016-09-15 DIAGNOSIS — M79642 Pain in left hand: Secondary | ICD-10-CM | POA: Insufficient documentation

## 2016-09-15 DIAGNOSIS — F17210 Nicotine dependence, cigarettes, uncomplicated: Secondary | ICD-10-CM | POA: Insufficient documentation

## 2016-09-15 HISTORY — DX: Essential (primary) hypertension: I10

## 2016-09-15 MED ORDER — ACETAMINOPHEN 500 MG PO TABS
1000.0000 mg | ORAL_TABLET | Freq: Once | ORAL | Status: AC
  Administered 2016-09-15: 1000 mg via ORAL
  Filled 2016-09-15: qty 2

## 2016-09-15 MED ORDER — NAPROXEN 250 MG PO TABS
500.0000 mg | ORAL_TABLET | Freq: Once | ORAL | Status: AC
  Administered 2016-09-15: 500 mg via ORAL
  Filled 2016-09-15: qty 2

## 2016-09-15 MED ORDER — NAPROXEN 250 MG PO TABS: 500.0000 mg | ORAL_TABLET | Freq: Once | ORAL | Status: DC

## 2016-09-15 MED ORDER — MELOXICAM 15 MG PO TABS: 15.0000 mg | ORAL_TABLET | Freq: Every day | ORAL | 0 refills | Status: AC

## 2016-09-15 NOTE — ED Triage Notes (Signed)
C/o left hand pain x 2 days, worse on ring finger  States diff to move  Denies inj

## 2016-09-15 NOTE — ED Provider Notes (Signed)
[facility]   CSN: [id number] Arrival date & time: 09/15/16  [id number]     History   Chief Complaint Chief Complaint  Patient presents with  . Hand Injury    HPI Jasmine Mcguire is a 46 y.o. female.  The history is provided by the patient.  Hand Pain  This is a new problem. The current episode started more than 2 days ago. The problem occurs constantly. The problem has not changed since onset.Pertinent negatives include no chest pain, no abdominal pain, no headaches and no shortness of breath. Nothing aggravates the symptoms. Nothing relieves the symptoms. She has tried a warm compress for the symptoms. The treatment provided no relief.  Denies trauma, lifting, keyboarding, repetitive use injury.  This is her non dominant hand  Past Medical History:  Diagnosis Date  . Hypertension     There are no active problems to display for this patient.   Past Surgical History:  Procedure Laterality Date  . ANKLE SURGERY      OB History    No data available       Home Medications    Prior to Admission medications   Medication Sig Start Date End Date Taking? Authorizing Provider  amLODipine (NORVASC) 5 MG tablet Take 1 tablet (5 mg total) by mouth daily. 09/25/14   Benjiman CoreNathan Pickering, MD  promethazine (PHENERGAN) 25 MG tablet Take 1 tablet (25 mg total) by mouth every 6 (six) hours as needed for nausea. 09/25/14   Benjiman CoreNathan Pickering, MD    Family History No family history on file.  Social History Social History  Substance Use Topics  . Smoking status: Current Every Day Smoker    Packs/day: 1.00    Years: 7.00    Types: Cigarettes  . Smokeless tobacco: Not on file  . Alcohol use Yes     Comment: occasional     Allergies   Patient has no known allergies.   Review of Systems Review of Systems  Constitutional: Negative for fever.  Respiratory: Negative for shortness of breath.   Cardiovascular: Negative for chest pain.  Gastrointestinal: Negative  for abdominal pain.  Musculoskeletal: Positive for arthralgias. Negative for back pain, joint swelling and neck pain.  Skin: Negative for rash.  Neurological: Negative for weakness, numbness and headaches.  All other systems reviewed and are negative.    Physical Exam Updated Vital Signs BP (!) 189/120 (BP Location: Right Arm)   Pulse 98   Temp 98.8 F (37.1 C) (Oral)   Resp 18   Ht 5\' 11"  (1.803 m)   Wt 230 lb (104.3 kg)   SpO2 99%   BMI 32.08 kg/m   Physical Exam  Constitutional: She is oriented to person, place, and time. She appears well-developed and well-nourished. No distress.  HENT:  Head: Normocephalic and atraumatic.  Nose: Nose normal.  Eyes: Conjunctivae and EOM are normal.  Neck: Normal range of motion. Neck supple.  Cardiovascular: Normal rate, regular rhythm and intact distal pulses.   Pulmonary/Chest: Effort normal and breath sounds normal. She has no wheezes. She has no rales.  Abdominal: Soft. Bowel sounds are normal. There is no tenderness.  Musculoskeletal: Normal range of motion. She exhibits no edema or deformity.       Left elbow: Normal.       Left wrist: Normal. She exhibits normal range of motion, no tenderness, no bony tenderness, no swelling, no effusion, no crepitus, no deformity and no laceration.       Left forearm:  Normal.       Left hand: She exhibits normal range of motion, normal two-point discrimination, normal capillary refill, no deformity, no laceration and no swelling. Normal sensation noted. Normal strength noted.       Hands: Neurological: She is alert and oriented to person, place, and time. She displays normal reflexes. She exhibits normal muscle tone.  Skin: Skin is warm and dry. Capillary refill takes less than 2 seconds. No rash noted. No erythema. No pallor.  Psychiatric: She has a normal mood and affect.     ED Treatments / Results   Vitals:   09/15/16 0325  BP: (!) 189/120  Pulse: 98  Resp: 18  Temp: 98.8 F (37.1 C)     Radiology Dg Hand Complete Left  Result Date: 09/15/2016 CLINICAL DATA:  Nontraumatic left hand pain at the third through fifth MCP joints. EXAM: LEFT HAND - COMPLETE 3+ VIEW COMPARISON:  None. FINDINGS: There is no evidence of fracture or dislocation. There is no evidence of arthropathy or other focal bone abnormality. Soft tissues are unremarkable. IMPRESSION: Negative. Electronically Signed   By: Ellery Plunk M.D.   On: 09/15/2016 03:50    Procedures Procedures (including critical care time)  Medications Ordered in ED  Medications  naproxen (NAPROSYN) tablet 500 mg (not administered)  naproxen (NAPROSYN) tablet 500 mg (not administered)  acetaminophen (TYLENOL) tablet 1,000 mg (not administered)     Final Clinical Impressions(s) / ED Diagnoses  Hand pain:  I suspect she had some trauma to that area and can't recall it.  Exam and xrays are normal.  Will treat with ice, elevation and NSAIDs.  Will have patient follow up with Dr.  Pearletha Forge for persistent symptoms.  See your PMD related to your BP, though some of this is likely secondary to pain  I have reviewed the triage vital signs and the nursing notes. Pertinent images were available during my care of the patient were reviewed by me and considered in my medical decision making.  After history, exam, and medical workup I feel the patient has been appropriately medically screened and is safe for discharge home. Pertinent diagnoses were discussed with the patient. Patient was given return precautions. Return immediately for fevers, continued intractable nausea and vomiting, shortness of breath, lightheadedness or any concerns. Follow up with your PMD for recheck in 2 days and Dr. Pearletha Forge for persistent symptoms.       Cy Blamer, MD 09/15/16 [phone]

## 2017-05-15 ENCOUNTER — Encounter (HOSPITAL_BASED_OUTPATIENT_CLINIC_OR_DEPARTMENT_OTHER): Payer: Self-pay | Admitting: Emergency Medicine

## 2017-05-15 ENCOUNTER — Emergency Department (HOSPITAL_BASED_OUTPATIENT_CLINIC_OR_DEPARTMENT_OTHER): Payer: Self-pay

## 2017-05-15 ENCOUNTER — Emergency Department (HOSPITAL_BASED_OUTPATIENT_CLINIC_OR_DEPARTMENT_OTHER)
Admission: EM | Admit: 2017-05-15 | Discharge: 2017-05-15 | Disposition: A | Discharge status: Home / Self Care | Payer: Self-pay | Attending: Emergency Medicine | Admitting: Emergency Medicine

## 2017-05-15 ENCOUNTER — Other Ambulatory Visit: Payer: Self-pay

## 2017-05-15 DIAGNOSIS — I10 Essential (primary) hypertension: Secondary | ICD-10-CM

## 2017-05-15 DIAGNOSIS — K2100 Gastro-esophageal reflux disease with esophagitis, without bleeding: Secondary | ICD-10-CM

## 2017-05-15 DIAGNOSIS — Z791 Long term (current) use of non-steroidal anti-inflammatories (NSAID): Secondary | ICD-10-CM | POA: Insufficient documentation

## 2017-05-15 DIAGNOSIS — Z79899 Other long term (current) drug therapy: Secondary | ICD-10-CM | POA: Insufficient documentation

## 2017-05-15 DIAGNOSIS — R079 Chest pain, unspecified: Secondary | ICD-10-CM

## 2017-05-15 DIAGNOSIS — F17210 Nicotine dependence, cigarettes, uncomplicated: Secondary | ICD-10-CM | POA: Insufficient documentation

## 2017-05-15 LAB — CBC
HCT: 29.1 % — ABNORMAL LOW (ref 36.0–46.0)
Hemoglobin: 9.6 g/dL — ABNORMAL LOW (ref 12.0–15.0)
MCH: 27.7 pg (ref 26.0–34.0)
MCHC: 33 g/dL (ref 30.0–36.0)
MCV: 84.1 fL (ref 78.0–100.0)
PLATELETS: 377 10*3/uL (ref 150–400)
RBC: 3.46 MIL/uL — ABNORMAL LOW (ref 3.87–5.11)
RDW: 16.3 % — AB (ref 11.5–15.5)
WBC: 8.2 10*3/uL (ref 4.0–10.5)

## 2017-05-15 LAB — BASIC METABOLIC PANEL
Anion gap: 7 (ref 5–15)
BUN: 25 mg/dL — AB (ref 6–20)
CALCIUM: 9.1 mg/dL (ref 8.9–10.3)
CO2: 27 mmol/L (ref 22–32)
Chloride: 104 mmol/L (ref 101–111)
Creatinine, Ser: 1.25 mg/dL — ABNORMAL HIGH (ref 0.44–1.00)
GFR calc Af Amer: 60 mL/min — ABNORMAL LOW (ref >60)
GFR, EST NON AFRICAN AMERICAN: 52 mL/min — AB (ref >60)
GLUCOSE: 93 mg/dL (ref 65–99)
Potassium: 3.7 mmol/L (ref 3.5–5.1)
SODIUM: 138 mmol/L (ref 135–145)

## 2017-05-15 LAB — D-DIMER, QUANTITATIVE (NOT AT ARMC): D DIMER QUANT: 0.3 ug{FEU}/mL (ref 0.00–0.50)

## 2017-05-15 LAB — TROPONIN I: Troponin I: <0.03 ng/mL (ref <0.03)

## 2017-05-15 MED ORDER — GI COCKTAIL ~~LOC~~
30.0000 mL | Freq: Once | ORAL | Status: AC
  Administered 2017-05-15: 30 mL via ORAL
  Filled 2017-05-15: qty 30

## 2017-05-15 MED ORDER — FAMOTIDINE 20 MG PO TABS: 20.0000 mg | ORAL_TABLET | Freq: Two times a day (BID) | ORAL | 0 refills | Status: AC

## 2017-05-15 MED ORDER — AMLODIPINE BESYLATE 5 MG PO TABS: 5.0000 mg | ORAL_TABLET | Freq: Every day | ORAL | 0 refills | Status: AC

## 2017-05-15 MED FILL — AMLODIPINE BESYLATE 5 MG TA: 5 | 30 days supply | Qty: 30 | Fill #0

## 2017-05-15 MED FILL — FAMOTIDINE 20 MG TABLET: 20 | 15 days supply | Qty: 30 | Fill #0

## 2017-05-15 NOTE — ED Triage Notes (Signed)
Patient states she woke up with chest pain earlier this am. The patient reports that it hurst to "breath"

## 2017-05-15 NOTE — ED Provider Notes (Signed)
[facility] EMERGENCY DEPARTMENT Provider Note   CSN: [id number] Arrival date & time: 05/15/17  1024     History   Chief Complaint Chief Complaint  Patient presents with  . Chest Pain    HPI Jasmine Mcguire is a 46 y.o. female.  The history is provided by the patient. No language interpreter was used.  Chest Pain     Jasmine Mcguire is a 46 y.o. female who presents to the Emergency Department complaining of chest pain.  She woke this morning with central chest pain.  Pain is worse with breathing.  It is described as a pain and heaviness sensation.  She had one episode of emesis.  No fevers, cough, abdominal pain, diarrhea, leg swelling or pain.  No prior similar symptoms.  She is a smoker and drinks occasional alcohol.  No family history of cardiac disease.  She denies any history of hypertension or hyperlipidemia but she has been told her blood pressure is elevated on prior ED visits.  Symptoms are moderate and constant in nature. Past Medical History:  Diagnosis Date  . Hypertension     There are no active problems to display for this patient.   Past Surgical History:  Procedure Laterality Date  . ANKLE SURGERY      OB History    No data available       Home Medications    Prior to Admission medications   Medication Sig Start Date End Date Taking? Authorizing Provider  amLODipine (NORVASC) 5 MG tablet Take 1 tablet (5 mg total) daily by mouth. 05/15/17   Tilden Fossaees, Hosie Sharman, MD  famotidine (PEPCID) 20 MG tablet Take 1 tablet (20 mg total) 2 (two) times daily by mouth. 05/15/17   Tilden Fossaees, Maanya Hippert, MD  meloxicam (MOBIC) 15 MG tablet Take 1 tablet (15 mg total) by mouth daily. 09/15/16   Palumbo, April, MD  promethazine (PHENERGAN) 25 MG tablet Take 1 tablet (25 mg total) by mouth every 6 (six) hours as needed for nausea. 09/25/14   Benjiman CorePickering, Nathan, MD    Family History History reviewed. No pertinent family history.  Social History Social History   Tobacco Use  .  Smoking status: Current Every Day Smoker    Packs/day: 1.00    Years: 7.00    Pack years: 7.00    Types: Cigarettes  . Smokeless tobacco: Never Used  Substance Use Topics  . Alcohol use: Yes    Comment: occasional  . Drug use: No     Allergies   Patient has no known allergies.   Review of Systems Review of Systems  Cardiovascular: Positive for chest pain.  All other systems reviewed and are negative.    Physical Exam Updated Vital Signs BP (!) 144/90   Pulse 78   Temp 98.4 F (36.9 C) (Oral)   Resp 13   Ht 5\' 11"  (1.803 m)   Wt 119.7 kg (264 lb)   LMP 05/10/2017   SpO2 99%   BMI 36.82 kg/m   Physical Exam  Constitutional: She is oriented to person, place, and time. She appears well-developed and well-nourished.  HENT:  Head: Normocephalic and atraumatic.  Cardiovascular: Normal rate and regular rhythm.  No murmur heard. Pulmonary/Chest: Effort normal and breath sounds normal. No respiratory distress.  Abdominal: Soft. There is no tenderness. There is no rebound and no guarding.  Musculoskeletal: She exhibits no edema or tenderness.  Neurological: She is alert and oriented to person, place, and time.  Skin: Skin is warm and dry.  Psychiatric: She has a normal mood and affect. Her behavior is normal.  Nursing note and vitals reviewed.    ED Treatments / Results  Labs (all labs ordered are listed, but only abnormal results are displayed) Labs Reviewed  BASIC METABOLIC PANEL - Abnormal; Notable for the following components:      Result Value   BUN 25 (*)    Creatinine, Ser 1.25 (*)    GFR calc non Af Amer 52 (*)    GFR calc Af Amer 60 (*)    All other components within normal limits  CBC - Abnormal; Notable for the following components:   RBC 3.46 (*)    Hemoglobin 9.6 (*)    HCT 29.1 (*)    RDW 16.3 (*)    All other components within normal limits  TROPONIN I  D-DIMER, QUANTITATIVE (NOT [facility])  TROPONIN I    EKG  EKG  Interpretation  Date/Time:  Tuesday May 15 2017 10:28:33 EST Ventricular Rate:  94 PR Interval:  140 QRS Duration: 88 QT Interval:  372 QTC Calculation: 465 R Axis:   -26 Text Interpretation:  Normal sinus rhythm Normal ECG Confirmed by Tilden Fossaees, Analeia Ismael [phone]([id number]) on 05/15/2017 10:54:17 AM       Radiology Dg Chest 2 View  Result Date: 05/15/2017 CLINICAL DATA:  Chest pain with elevated blood pressure. EXAM: CHEST  2 VIEW COMPARISON:  None. FINDINGS: The lungs are clear without focal pneumonia, edema, pneumothorax or pleural effusion. The cardiopericardial silhouette is within normal limits for size. The visualized bony structures of the thorax are intact. IMPRESSION: No active cardiopulmonary disease. Electronically Signed   By: Kennith CenterEric  Mansell M.D.   On: 05/15/2017 11:30    Procedures Procedures (including critical care time)  Medications Ordered in ED Medications  gi cocktail (Maalox,Lidocaine,Donnatal) (30 mLs Oral Given 05/15/17 1153)     Initial Impression / Assessment and Plan / ED Course  I have reviewed the triage vital signs and the nursing notes.  Pertinent labs & imaging results that were available during my care of the patient were reviewed by me and considered in my medical decision making (see chart for details).     Patient here for evaluation of chest pain, prior diagnosis of hypertension but not currently on any medications.  Her pain improved after GI cocktail in the emergency department.  Presentation is not consistent with hypertensive urgency, PE, dissection, ACS.  Discussed with patient home care for likely reflux as well as hypertension.  Will restart her Norvasc.  Discussed outpatient follow-up and return precautions.  Final Clinical Impressions(s) / ED Diagnoses   Final diagnoses:  Nonspecific chest pain  Reflux esophagitis  Essential hypertension    ED Discharge Orders        Ordered    famotidine (PEPCID) 20 MG tablet  2 times daily      05/15/17 1507    amLODipine (NORVASC) 5 MG tablet  Daily     05/15/17 1507       Tilden Fossaees, Kiaan Overholser, MD 05/15/17 [phone]

## 2017-08-13 IMAGING — DX DG HAND COMPLETE 3+V*L*
3 series · 3 of 3 positions shown · non-contrast
Comparison: None.

CLINICAL DATA: Nontraumatic left hand pain at the third through
fifth MCP joints.

EXAM:
LEFT HAND - COMPLETE 3+ VIEW

[hand pa]
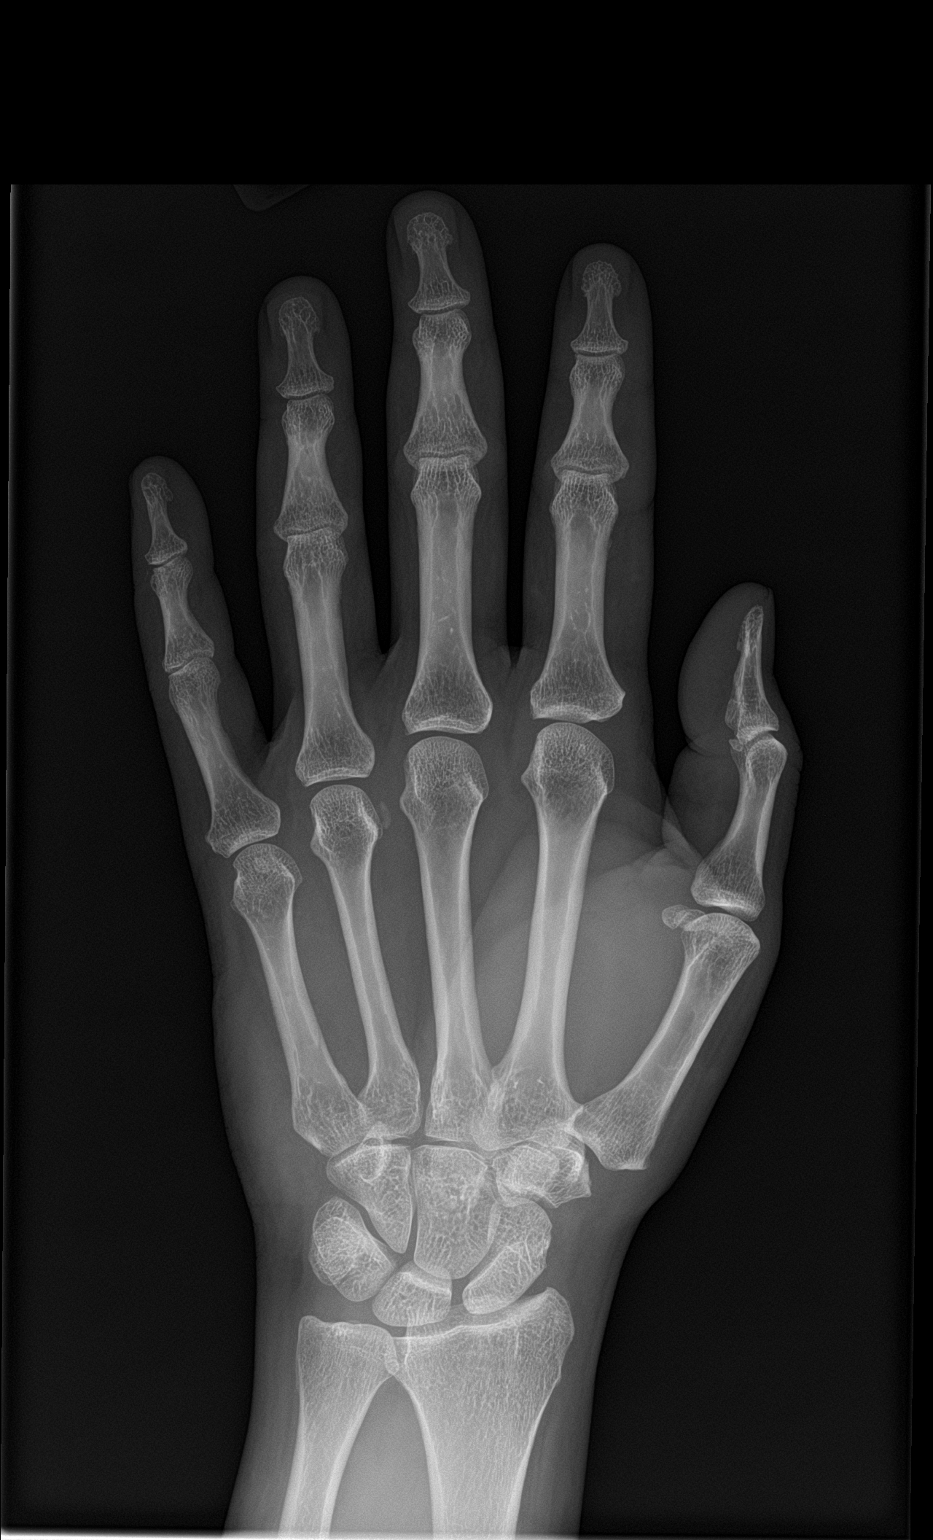

[hand obl]
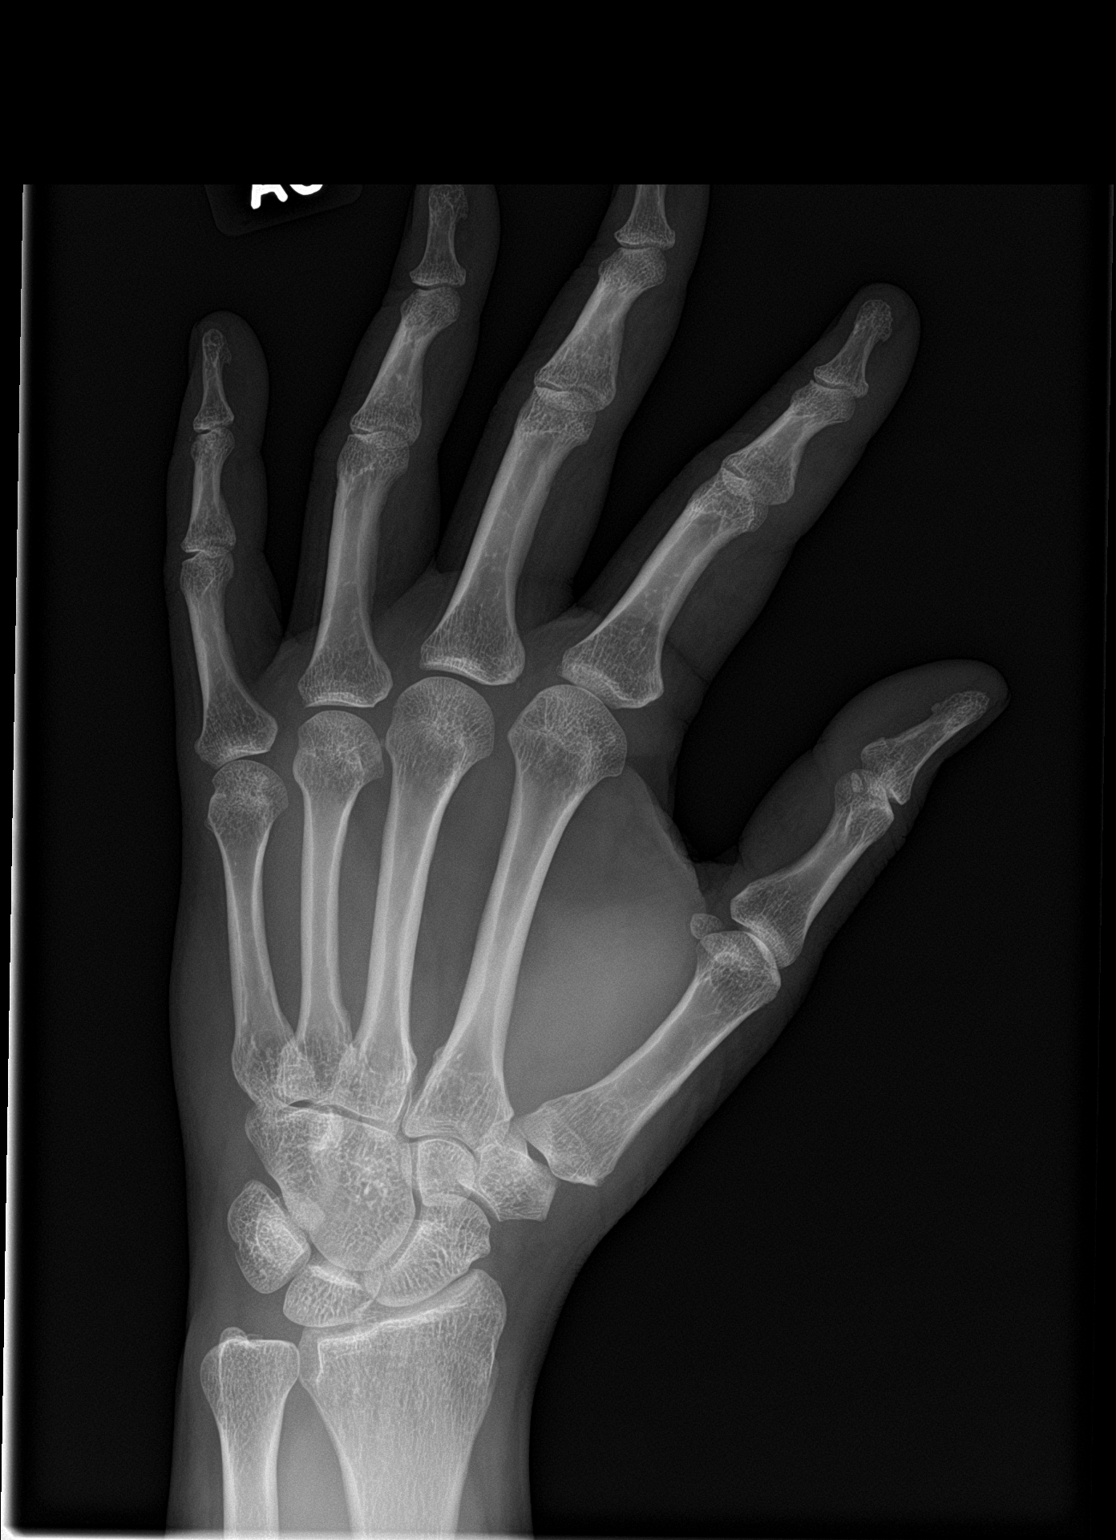

[hand lat]
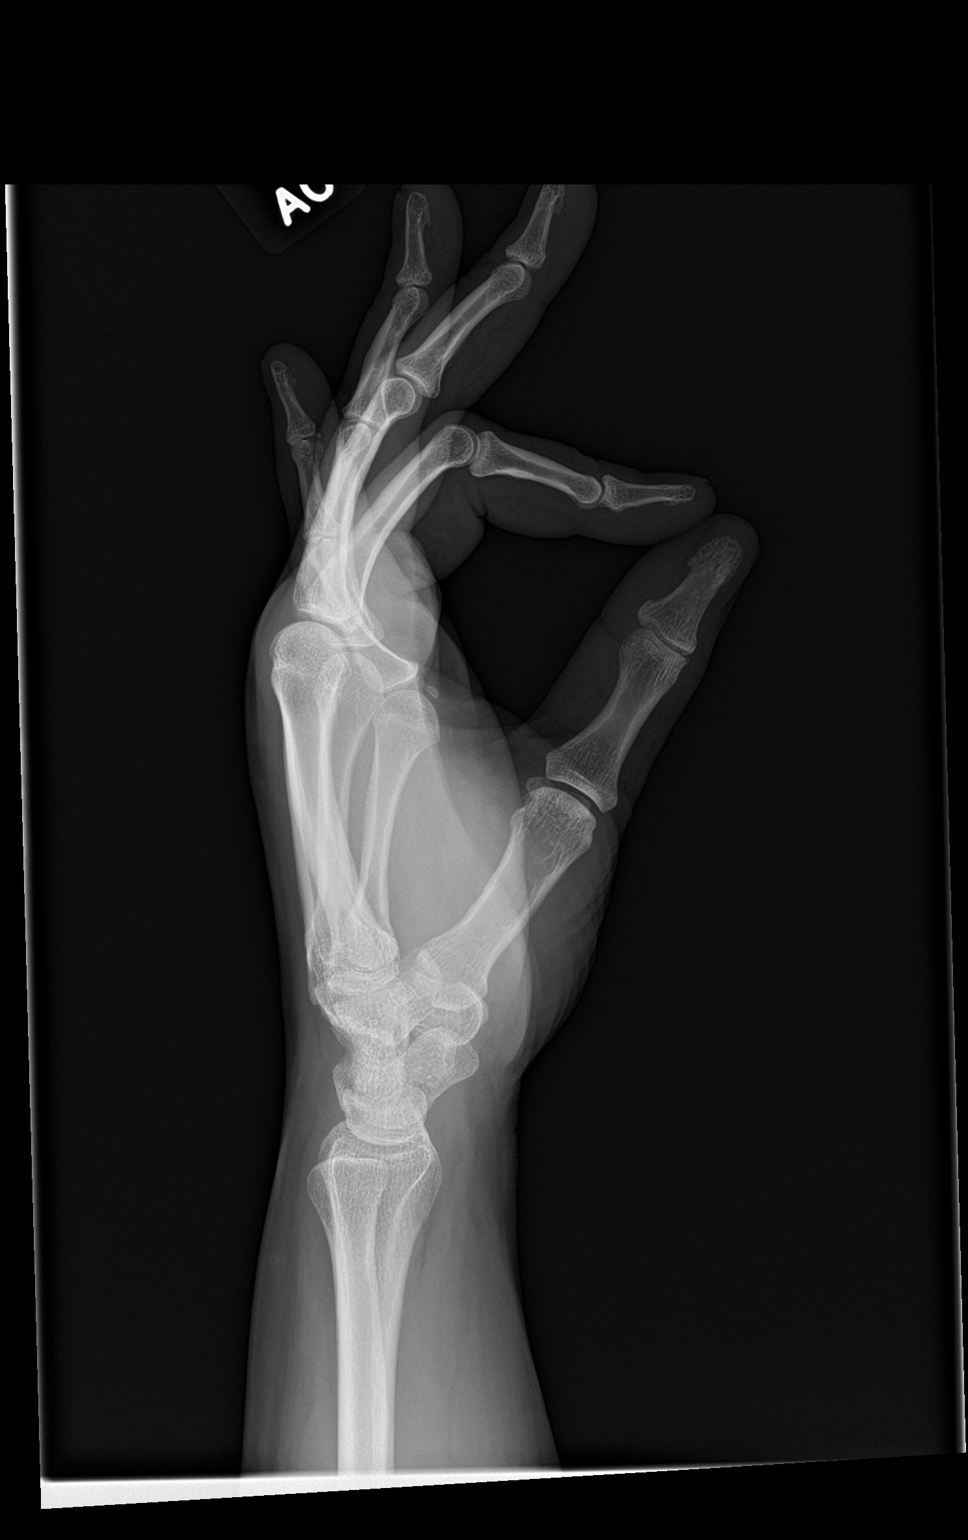

[3 of 3 positions shown; findings below may reference images not displayed]

FINDINGS: There is no evidence of fracture or dislocation. There is no
evidence of arthropathy or other focal bone abnormality. Soft
tissues are unremarkable.
IMPRESSION: Negative.

## 2018-04-12 IMAGING — CR DG CHEST 2V
2 series · 2 of 2 positions shown · non-contrast
Comparison: None.

CLINICAL DATA: Chest pain with elevated blood pressure.

EXAM:
CHEST  2 VIEW

[w chest pa]
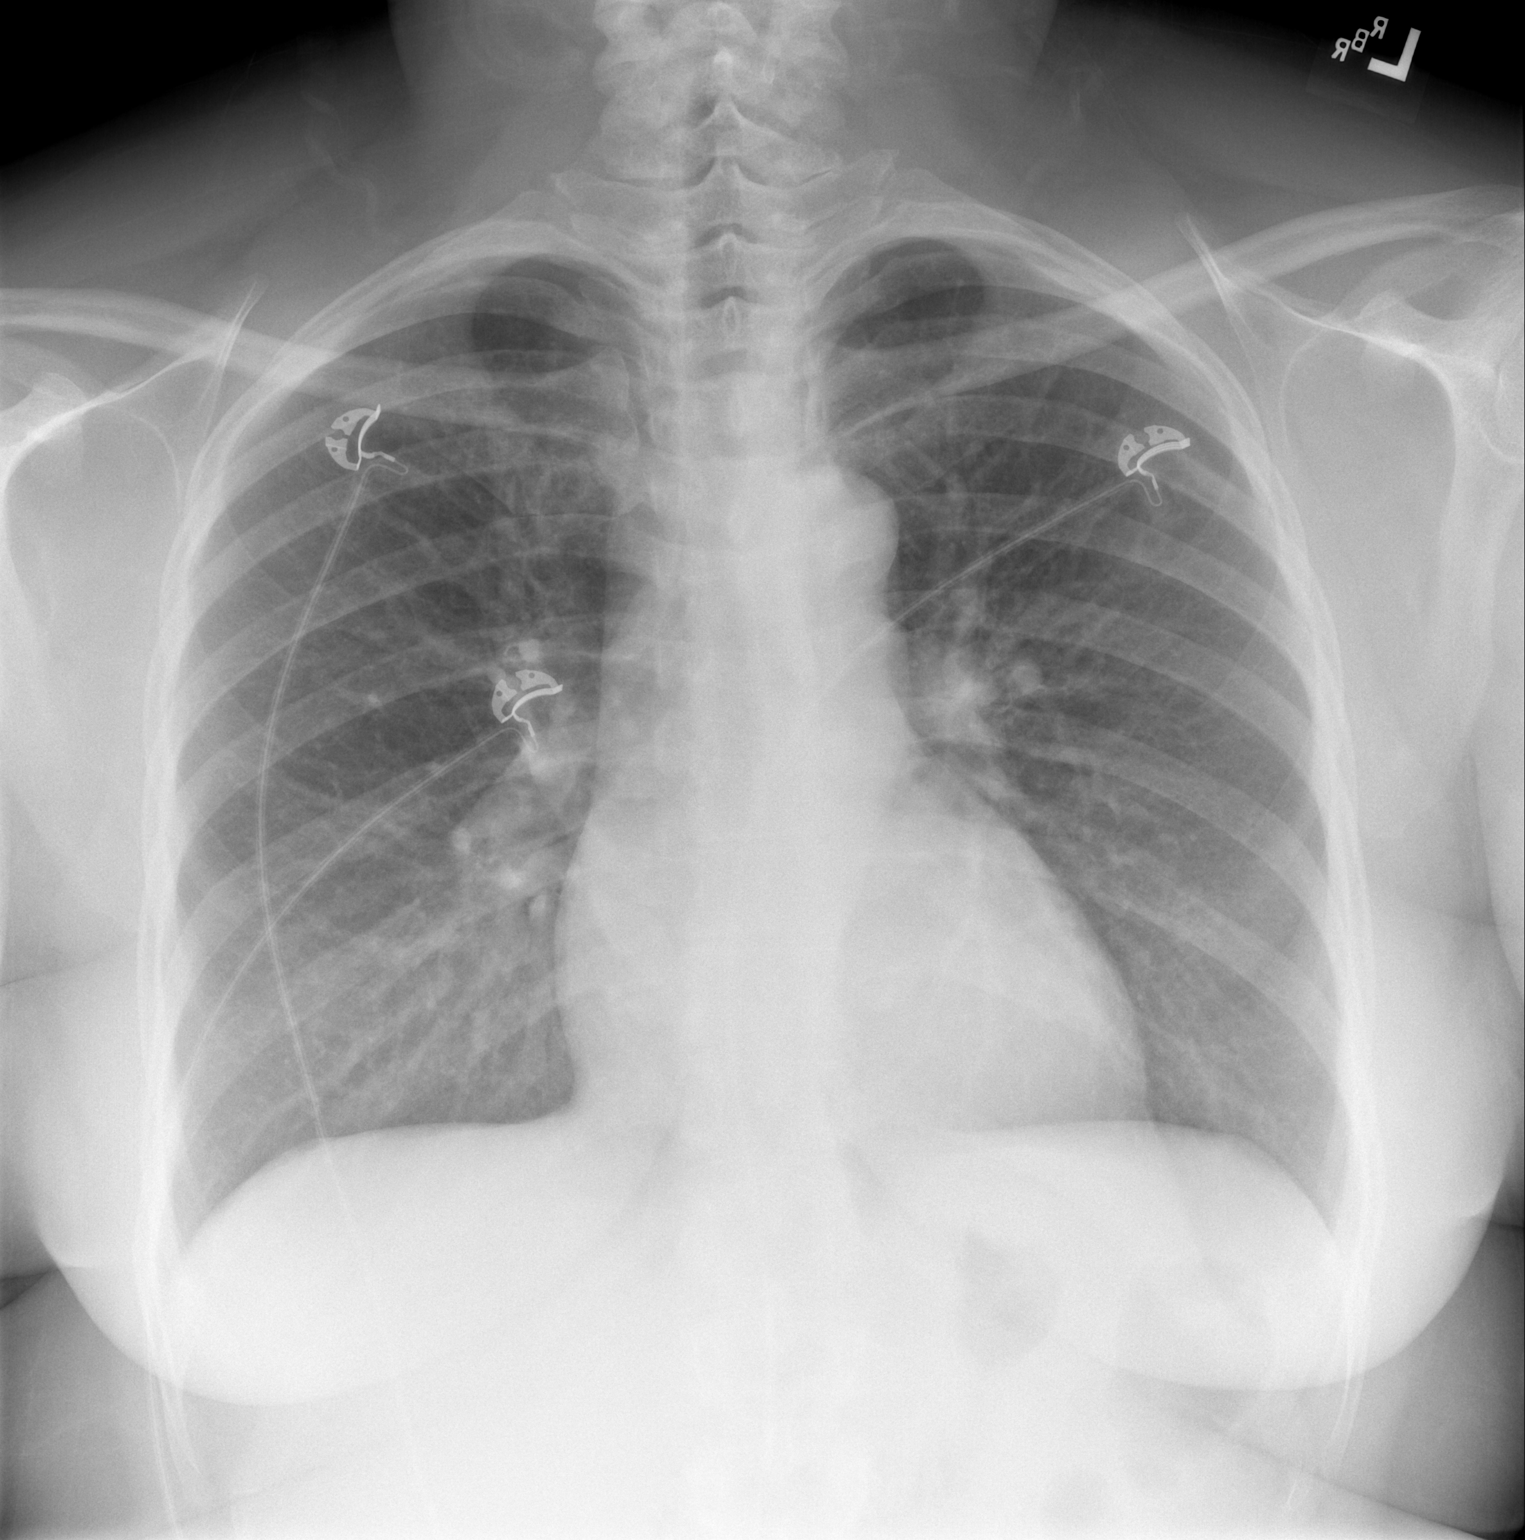

[w chest lat]
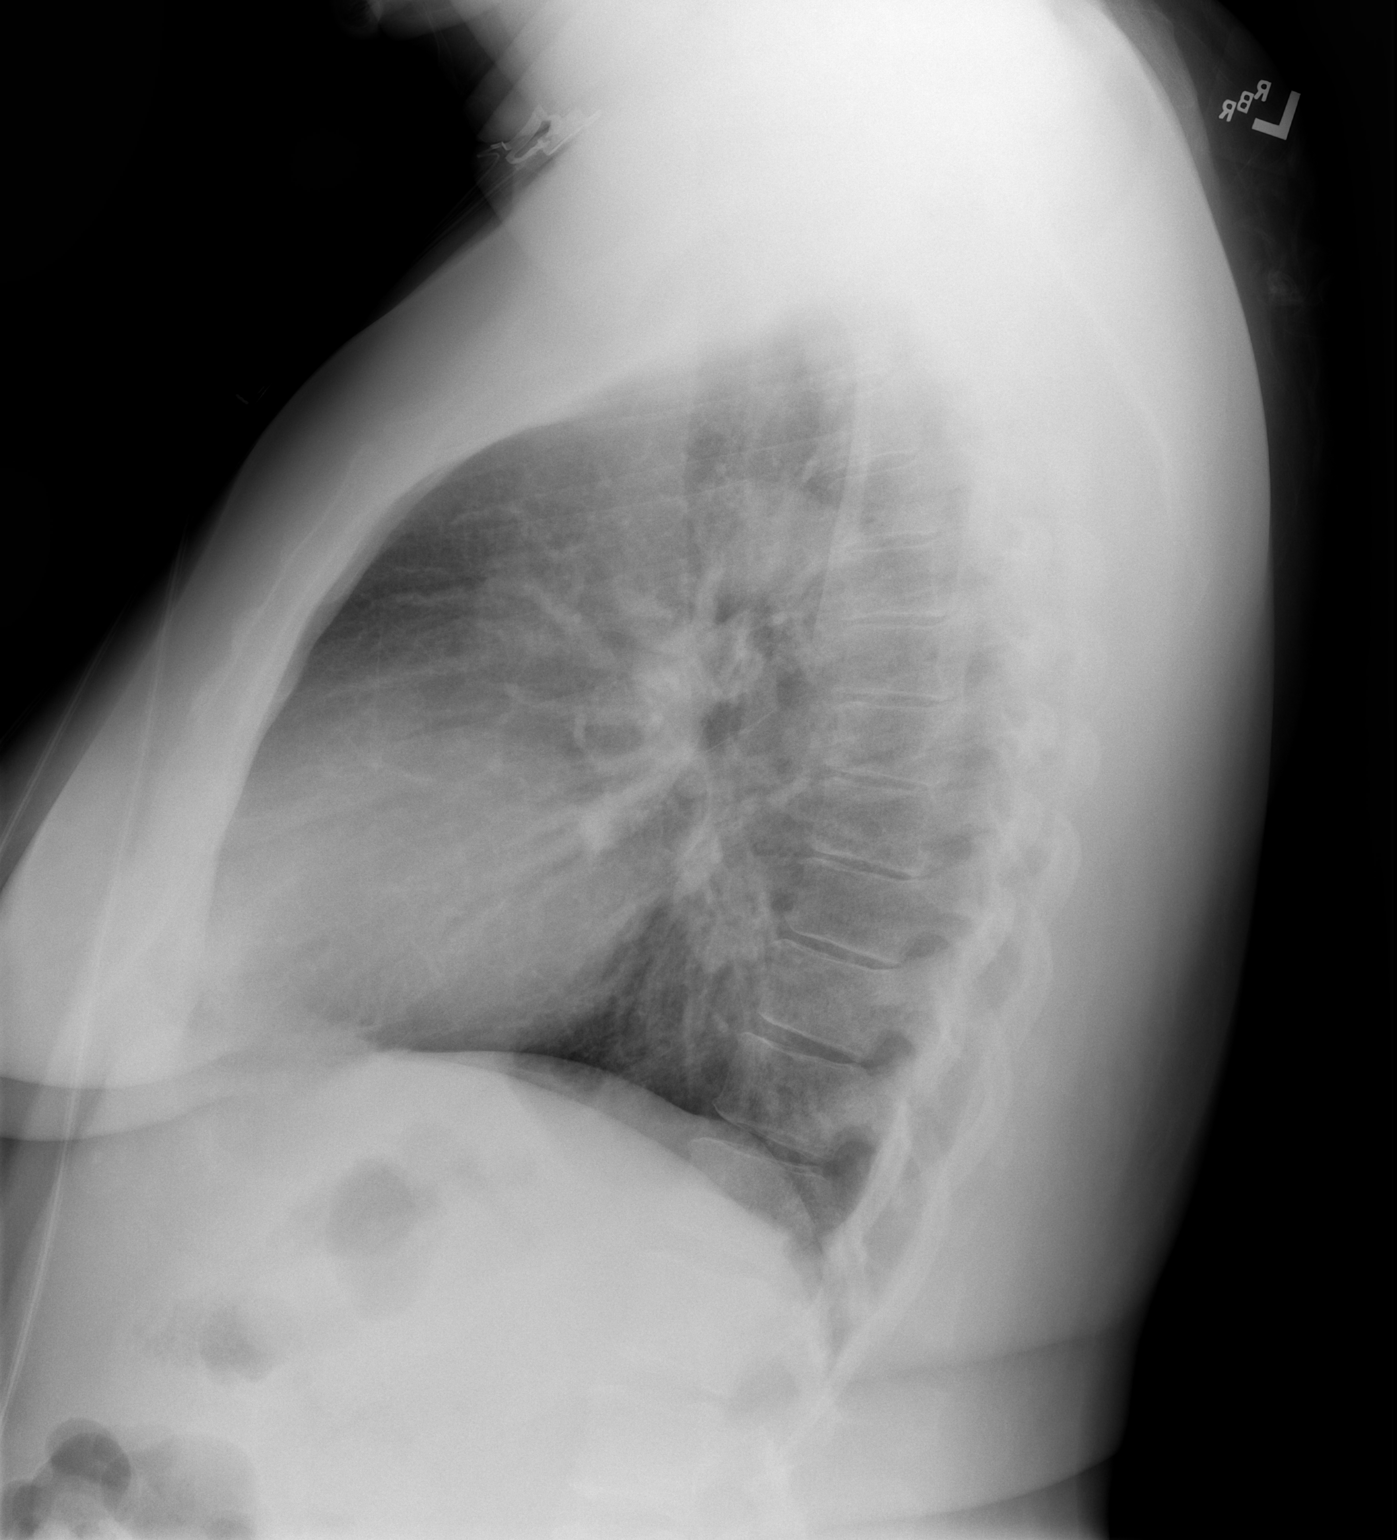

[2 of 2 positions shown; findings below may reference images not displayed]

FINDINGS: The lungs are clear without focal pneumonia, edema, pneumothorax or
pleural effusion. The cardiopericardial silhouette is within normal
limits for size. The visualized bony structures of the thorax are
intact.
IMPRESSION: No active cardiopulmonary disease.
